# Patient Record
Sex: Female | Born: 1970 | Marital: Single | Smoking: Current some day smoker
Health system: Southern US, Community
[De-identification: ages and names within clinical notes are randomized; demographics above are authoritative.]

## PROBLEM LIST (undated history)

## (undated) DIAGNOSIS — J45909 Unspecified asthma, uncomplicated: Secondary | ICD-10-CM

---

## 2010-10-29 ENCOUNTER — Other Ambulatory Visit: Payer: Self-pay | Admitting: Obstetrics & Gynecology

## 2010-10-29 DIAGNOSIS — O3660X0 Maternal care for excessive fetal growth, unspecified trimester, not applicable or unspecified: Secondary | ICD-10-CM

## 2010-11-08 ENCOUNTER — Ambulatory Visit (HOSPITAL_COMMUNITY)
Admission: RE | Admit: 2010-11-08 | Discharge: 2010-11-08 | Disposition: A | Discharge status: Home / Self Care | Payer: Medicaid Other | Source: Ambulatory Visit | Attending: Obstetrics & Gynecology | Admitting: Obstetrics & Gynecology

## 2010-11-08 ENCOUNTER — Other Ambulatory Visit: Payer: Self-pay | Admitting: Obstetrics & Gynecology

## 2010-11-08 DIAGNOSIS — Z3689 Encounter for other specified antenatal screening: Secondary | ICD-10-CM | POA: Insufficient documentation

## 2010-11-08 DIAGNOSIS — O3660X0 Maternal care for excessive fetal growth, unspecified trimester, not applicable or unspecified: Secondary | ICD-10-CM | POA: Insufficient documentation

## 2010-12-12 ENCOUNTER — Inpatient Hospital Stay (HOSPITAL_COMMUNITY)
Admission: AD | Admit: 2010-12-12 | Discharge: 2010-12-15 | DRG: 775 | Disposition: A | Discharge status: Home / Self Care | Payer: Medicaid Other | Source: Ambulatory Visit | Attending: Obstetrics and Gynecology | Admitting: Obstetrics and Gynecology

## 2010-12-12 DIAGNOSIS — O09519 Supervision of elderly primigravida, unspecified trimester: Secondary | ICD-10-CM | POA: Diagnosis present

## 2010-12-12 DIAGNOSIS — O480 Post-term pregnancy: Secondary | ICD-10-CM | POA: Diagnosis present

## 2010-12-12 DIAGNOSIS — O139 Gestational [pregnancy-induced] hypertension without significant proteinuria, unspecified trimester: Principal | ICD-10-CM | POA: Diagnosis present

## 2010-12-12 LAB — URINALYSIS, ROUTINE W REFLEX MICROSCOPIC
Bilirubin Urine: NEGATIVE
Glucose, UA: NEGATIVE mg/dL
Ketones, ur: NEGATIVE mg/dL
Leukocytes, UA: NEGATIVE
Protein, ur: NEGATIVE mg/dL

## 2010-12-12 LAB — PROTEIN / CREATININE RATIO, URINE
Creatinine, Urine: 49.4 mg/dL
Total Protein, Urine: 7.1 mg/dL

## 2010-12-12 LAB — CBC
Hemoglobin: 11.9 g/dL — ABNORMAL LOW (ref 12.0–15.0)
MCH: 30.7 pg (ref 26.0–34.0)
MCHC: 33.6 g/dL (ref 30.0–36.0)
MCV: 91.2 fL (ref 78.0–100.0)

## 2010-12-12 LAB — COMPREHENSIVE METABOLIC PANEL
Alkaline Phosphatase: 166 U/L — ABNORMAL HIGH (ref 39–117)
BUN: 13 mg/dL (ref 6–23)
Chloride: 103 mEq/L (ref 96–112)
Glucose, Bld: 78 mg/dL (ref 70–99)
Potassium: 4.1 mEq/L (ref 3.5–5.1)
Total Bilirubin: 0.2 mg/dL — ABNORMAL LOW (ref 0.3–1.2)
Total Protein: 6.4 g/dL (ref 6.0–8.3)

## 2010-12-12 LAB — URINE MICROSCOPIC-ADD ON

## 2010-12-13 DIAGNOSIS — O09519 Supervision of elderly primigravida, unspecified trimester: Secondary | ICD-10-CM

## 2010-12-13 DIAGNOSIS — O139 Gestational [pregnancy-induced] hypertension without significant proteinuria, unspecified trimester: Secondary | ICD-10-CM

## 2010-12-13 DIAGNOSIS — O480 Post-term pregnancy: Secondary | ICD-10-CM

## 2010-12-15 ENCOUNTER — Encounter (HOSPITAL_COMMUNITY)
Admission: RE | Admit: 2010-12-15 | Discharge: 2010-12-15 | Disposition: A | Discharge status: Home / Self Care | Payer: Medicaid Other | Source: Ambulatory Visit | Attending: Obstetrics and Gynecology | Admitting: Obstetrics and Gynecology

## 2010-12-15 DIAGNOSIS — O923 Agalactia: Secondary | ICD-10-CM | POA: Insufficient documentation

## 2014-08-03 ENCOUNTER — Encounter (HOSPITAL_BASED_OUTPATIENT_CLINIC_OR_DEPARTMENT_OTHER): Payer: Self-pay

## 2014-08-03 ENCOUNTER — Emergency Department (HOSPITAL_BASED_OUTPATIENT_CLINIC_OR_DEPARTMENT_OTHER)
Admission: EM | Admit: 2014-08-03 | Discharge: 2014-08-03 | Disposition: A | Discharge status: Home / Self Care | Payer: Medicaid Other | Attending: Emergency Medicine | Admitting: Emergency Medicine

## 2014-08-03 ENCOUNTER — Emergency Department (HOSPITAL_BASED_OUTPATIENT_CLINIC_OR_DEPARTMENT_OTHER): Payer: Medicaid Other

## 2014-08-03 DIAGNOSIS — J45901 Unspecified asthma with (acute) exacerbation: Secondary | ICD-10-CM | POA: Insufficient documentation

## 2014-08-03 DIAGNOSIS — R21 Rash and other nonspecific skin eruption: Secondary | ICD-10-CM | POA: Diagnosis not present

## 2014-08-03 DIAGNOSIS — Z79899 Other long term (current) drug therapy: Secondary | ICD-10-CM | POA: Insufficient documentation

## 2014-08-03 DIAGNOSIS — H9209 Otalgia, unspecified ear: Secondary | ICD-10-CM | POA: Diagnosis not present

## 2014-08-03 DIAGNOSIS — Z720 Tobacco use: Secondary | ICD-10-CM | POA: Diagnosis not present

## 2014-08-03 DIAGNOSIS — R05 Cough: Secondary | ICD-10-CM | POA: Diagnosis present

## 2014-08-03 DIAGNOSIS — J069 Acute upper respiratory infection, unspecified: Secondary | ICD-10-CM | POA: Insufficient documentation

## 2014-08-03 DIAGNOSIS — J40 Bronchitis, not specified as acute or chronic: Secondary | ICD-10-CM

## 2014-08-03 DIAGNOSIS — R059 Cough, unspecified: Secondary | ICD-10-CM

## 2014-08-03 HISTORY — DX: Unspecified asthma, uncomplicated: J45.909

## 2014-08-03 LAB — CBC WITH DIFFERENTIAL/PLATELET
BASOS ABS: 0 10*3/uL (ref 0.0–0.1)
BASOS PCT: 1 % (ref 0–1)
EOS ABS: 0.1 10*3/uL (ref 0.0–0.7)
Eosinophils Relative: 2 % (ref 0–5)
HEMATOCRIT: 35.4 % — AB (ref 36.0–46.0)
HEMOGLOBIN: 12.2 g/dL (ref 12.0–15.0)
Lymphocytes Relative: 43 % (ref 12–46)
Lymphs Abs: 2.2 10*3/uL (ref 0.7–4.0)
MCH: 34.6 pg — ABNORMAL HIGH (ref 26.0–34.0)
MCHC: 34.5 g/dL (ref 30.0–36.0)
MCV: 100.3 fL — ABNORMAL HIGH (ref 78.0–100.0)
MONO ABS: 0.6 10*3/uL (ref 0.1–1.0)
Monocytes Relative: 12 % (ref 3–12)
NEUTROS PCT: 42 % — AB (ref 43–77)
Neutro Abs: 2 10*3/uL (ref 1.7–7.7)
PLATELETS: 170 10*3/uL (ref 150–400)
RBC: 3.53 MIL/uL — AB (ref 3.87–5.11)
RDW: 14.2 % (ref 11.5–15.5)
WBC: 4.9 10*3/uL (ref 4.0–10.5)

## 2014-08-03 LAB — COMPREHENSIVE METABOLIC PANEL
ALK PHOS: 85 U/L (ref 39–117)
ALT: 58 U/L — ABNORMAL HIGH (ref 0–35)
AST: 112 U/L — AB (ref 0–37)
Albumin: 3.5 g/dL (ref 3.5–5.2)
Anion gap: 3 — ABNORMAL LOW (ref 5–15)
BILIRUBIN TOTAL: 0.5 mg/dL (ref 0.3–1.2)
BUN: 11 mg/dL (ref 6–23)
CO2: 27 mmol/L (ref 19–32)
Calcium: 8.2 mg/dL — ABNORMAL LOW (ref 8.4–10.5)
Chloride: 106 mmol/L (ref 96–112)
Creatinine, Ser: 0.66 mg/dL (ref 0.50–1.10)
GFR calc non Af Amer: >90 mL/min (ref >90)
Glucose, Bld: 102 mg/dL — ABNORMAL HIGH (ref 70–99)
POTASSIUM: 3.2 mmol/L — AB (ref 3.5–5.1)
SODIUM: 136 mmol/L (ref 135–145)
Total Protein: 6.3 g/dL (ref 6.0–8.3)

## 2014-08-03 MED ORDER — SODIUM CHLORIDE 0.9 % IV SOLN
INTRAVENOUS | Status: DC
  Administered 2014-08-03: 17:00:00 via INTRAVENOUS

## 2014-08-03 MED ORDER — PREDNISONE 10 MG PO TABS: 40.0000 mg | ORAL_TABLET | Freq: Every day | ORAL | Status: AC

## 2014-08-03 MED ORDER — ONDANSETRON HCL 4 MG/2ML IJ SOLN
4.0000 mg | Freq: Once | INTRAMUSCULAR | Status: AC
  Administered 2014-08-03: 4 mg via INTRAVENOUS
  Filled 2014-08-03: qty 2

## 2014-08-03 MED ORDER — ALBUTEROL SULFATE HFA 108 (90 BASE) MCG/ACT IN AERS
2.0000 | INHALATION_SPRAY | Freq: Four times a day (QID) | RESPIRATORY_TRACT | Status: DC | PRN
  Administered 2014-08-03: 2 via RESPIRATORY_TRACT
  Filled 2014-08-03: qty 6.7

## 2014-08-03 MED ORDER — METHYLPREDNISOLONE SODIUM SUCC 125 MG IJ SOLR
125.0000 mg | Freq: Once | INTRAMUSCULAR | Status: AC
  Administered 2014-08-03: 125 mg via INTRAVENOUS
  Filled 2014-08-03: qty 2

## 2014-08-03 MED ORDER — SODIUM CHLORIDE 0.9 % IV BOLUS (SEPSIS)
1000.0000 mL | Freq: Once | INTRAVENOUS | Status: AC
  Administered 2014-08-03: 1000 mL via INTRAVENOUS

## 2014-08-03 NOTE — Discharge Instructions (Signed)
Usual albuterol inhaler 2 puffs every 6 hours for the next 7 days then as needed. Take the prednisone as directed for the next 5 days. Can supplement the with a cough suppressant medicine over-the-counter. Return for any new or worse symptoms.

## 2014-08-03 NOTE — ED Notes (Signed)
C/o loss of voice and "a cold" x 3 months "just barely when asked if cough present

## 2014-08-03 NOTE — ED Provider Notes (Signed)
CSN: [id number]     Arrival date & time 08/03/14  1358 History   First MD Initiated Contact with Patient 08/03/14 1604     Chief Complaint  Patient presents with  . URI     (Consider location/radiation/quality/duration/timing/severity/associated sxs/prior Treatment) Patient is a 44 y.o. female presenting with URI. The history is provided by the patient.  URI Presenting symptoms: congestion, cough and ear pain   Presenting symptoms: no fever   Associated symptoms: wheezing   Associated symptoms: no headaches and no neck pain    patient with three-month history of upper respiratory infection cough bronchitis, hoarseness. And a complaint of left ear pain. Patient has a history of asthma as been out of her albuterol inhaler for 3 weeks. Patient denies fever nausea vomiting or diarrhea.  Past Medical History  Diagnosis Date  . Asthma    History reviewed. No pertinent past surgical history. No family history on file. History  Substance Use Topics  . Smoking status: Current Some Day Smoker  . Smokeless tobacco: Not on file  . Alcohol Use: No   OB History    No data available     Review of Systems  Constitutional: Negative for fever.  HENT: Positive for congestion, ear pain and voice change.   Eyes: Negative for redness.  Respiratory: Positive for cough, shortness of breath and wheezing.   Cardiovascular: Negative for chest pain.  Gastrointestinal: Negative for nausea, vomiting and diarrhea.  Genitourinary: Negative for dysuria.  Musculoskeletal: Negative for back pain and neck pain.  Skin: Positive for rash.  Neurological: Negative for headaches.  Hematological: Does not bruise/bleed easily.  Psychiatric/Behavioral: Negative for confusion.      Allergies  Review of patient's allergies indicates no known allergies.  Home Medications   Prior to Admission medications   Medication Sig Start Date End Date Taking? Authorizing Provider  ALBUTEROL IN Inhale into the lungs.    Yes Historical Provider, MD  predniSONE (DELTASONE) 10 MG tablet Take 4 tablets (40 mg total) by mouth daily. 08/03/14   Vanetta MuldersScott Albion Weatherholtz, MD   BP 145/93 mmHg  Pulse 98  Temp(Src) 98.5 F (36.9 C) (Oral)  Resp 18  Ht 5\' 8"  (1.727 m)  Wt 215 lb (97.523 kg)  BMI 32.70 kg/m2  SpO2 98% Physical Exam  Constitutional: She appears well-developed and well-nourished. No distress.  HENT:  Head: Normocephalic and atraumatic.  Right Ear: External ear normal.  Left Ear: External ear normal.  Mouth/Throat: Oropharynx is clear and moist.  Eyes: Conjunctivae and EOM are normal. Pupils are equal, round, and reactive to light.  Neck: Normal range of motion. No thyromegaly present.  Patient with hoarseness but no enlargement to the thyroid.  Cardiovascular: Normal rate, regular rhythm and normal heart sounds.   No murmur heard. Pulmonary/Chest: Effort normal and breath sounds normal. No stridor. No respiratory distress. She has no wheezes.  Abdominal: Soft. Bowel sounds are normal. There is no tenderness.  Musculoskeletal: Normal range of motion.  Neurological: She is alert. No cranial nerve deficit. She exhibits normal muscle tone. Coordination normal.  Skin: Rash noted.  Rash around the mouth. Oral pharynx is clear. Suggestive of some fever blisters. No secondary infection. No other rash.  Nursing note and vitals reviewed.   ED Course  Procedures (including critical care time) Labs Review Labs Reviewed  COMPREHENSIVE METABOLIC PANEL - Abnormal; Notable for the following:    Potassium 3.2 (*)    Glucose, Bld 102 (*)    Calcium 8.2 (*)  AST 112 (*)    ALT 58 (*)    Anion gap 3 (*)    All other components within normal limits  CBC WITH DIFFERENTIAL/PLATELET - Abnormal; Notable for the following:    RBC 3.53 (*)    HCT 35.4 (*)    MCV 100.3 (*)    MCH 34.6 (*)    Neutrophils Relative % 42 (*)    All other components within normal limits    Imaging Review Dg Chest 2  View  08/03/2014   CLINICAL DATA:  44 year old with a cough, congestion and fatigue for 3 months.  EXAM: CHEST  2 VIEW  COMPARISON:  None.  FINDINGS: Few linear densities in the right lower lung may represent atelectasis. Otherwise, the lungs are clear. Heart size is normal. The trachea is midline. Negative for pleural effusions. No acute bone abnormalities.  IMPRESSION: Few linear densities in the right lower chest could represent atelectasis. Otherwise, no acute chest findings.   Electronically Signed   By: Richarda Overlie M.D.   On: 08/03/2014 16:38     EKG Interpretation None      MDM   Final diagnoses:  Cough  URI (upper respiratory infection)  Bronchitis    By history patient's had recurrent upper respiratory infections and bronchitis over the past 3 months. No wheezing here today. Workup negative for pneumonia significant leukocytosis or fever no wheezing. Patient will benefit from albuterol inhaler she does have a history of asthma and also what helped the cough and bronchitis. Patient 2 puffs every 6 hours next 7 days then as needed. Albuterol inhaler provided before she left. In addition patient will have a course of prednisone for the next 5 days. Initiated with Solu-Medrol 125 mg IV. Patient will return for any new or worse symptoms. Chest x-ray negative for pneumonia. Labs without significant anemia no significant which might abnormalities. Just a mild hypokalemia. Mild elevation in liver function tests.    Vanetta Mulders, MD 08/03/14 1747

## 2015-12-15 IMAGING — CR DG CHEST 2V
2 series · 2 of 2 positions shown · non-contrast
Comparison: None.

CLINICAL DATA: 43-year-old with a cough, congestion and fatigue for
3 months.

EXAM:
CHEST  2 VIEW

[w chest pa]
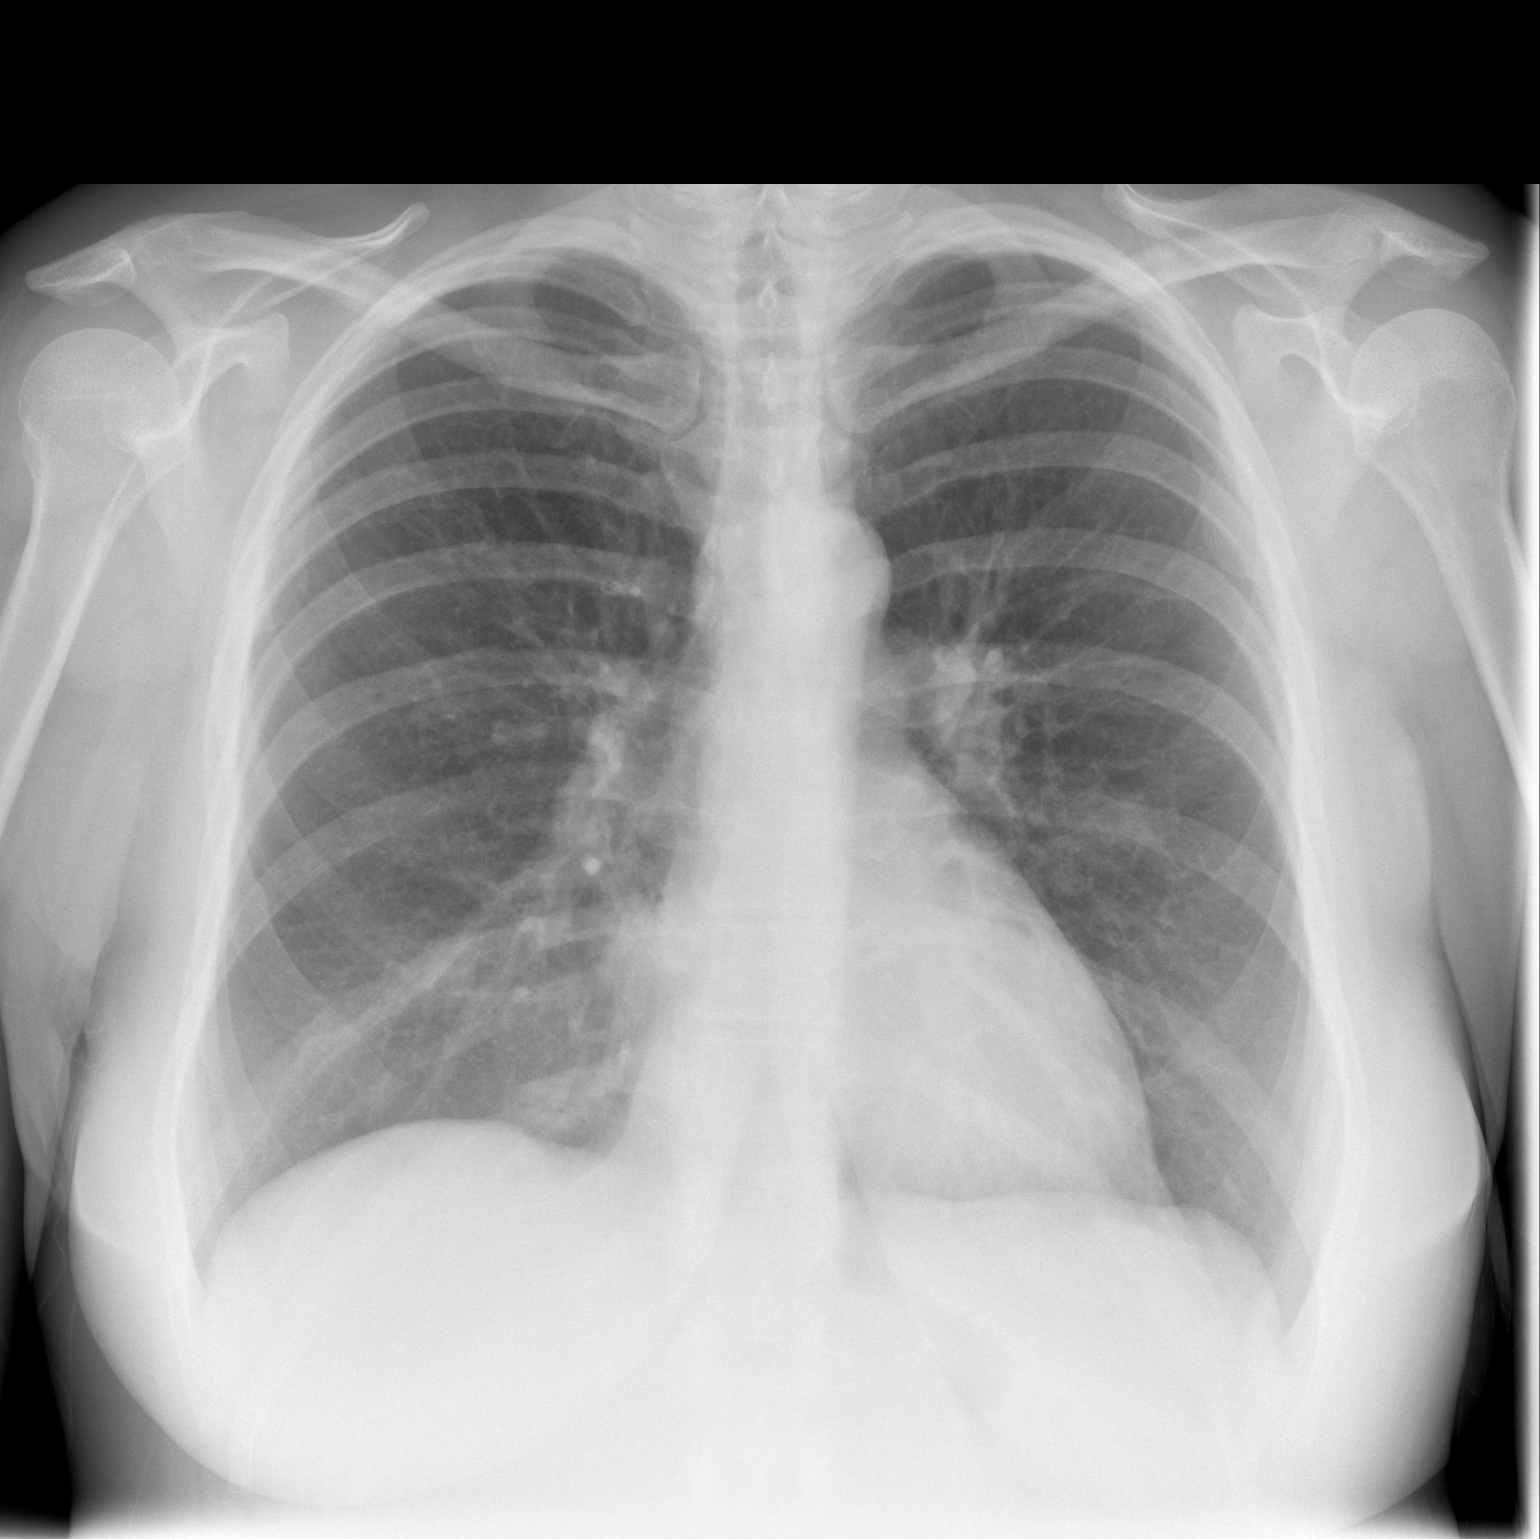

[w chest lat]
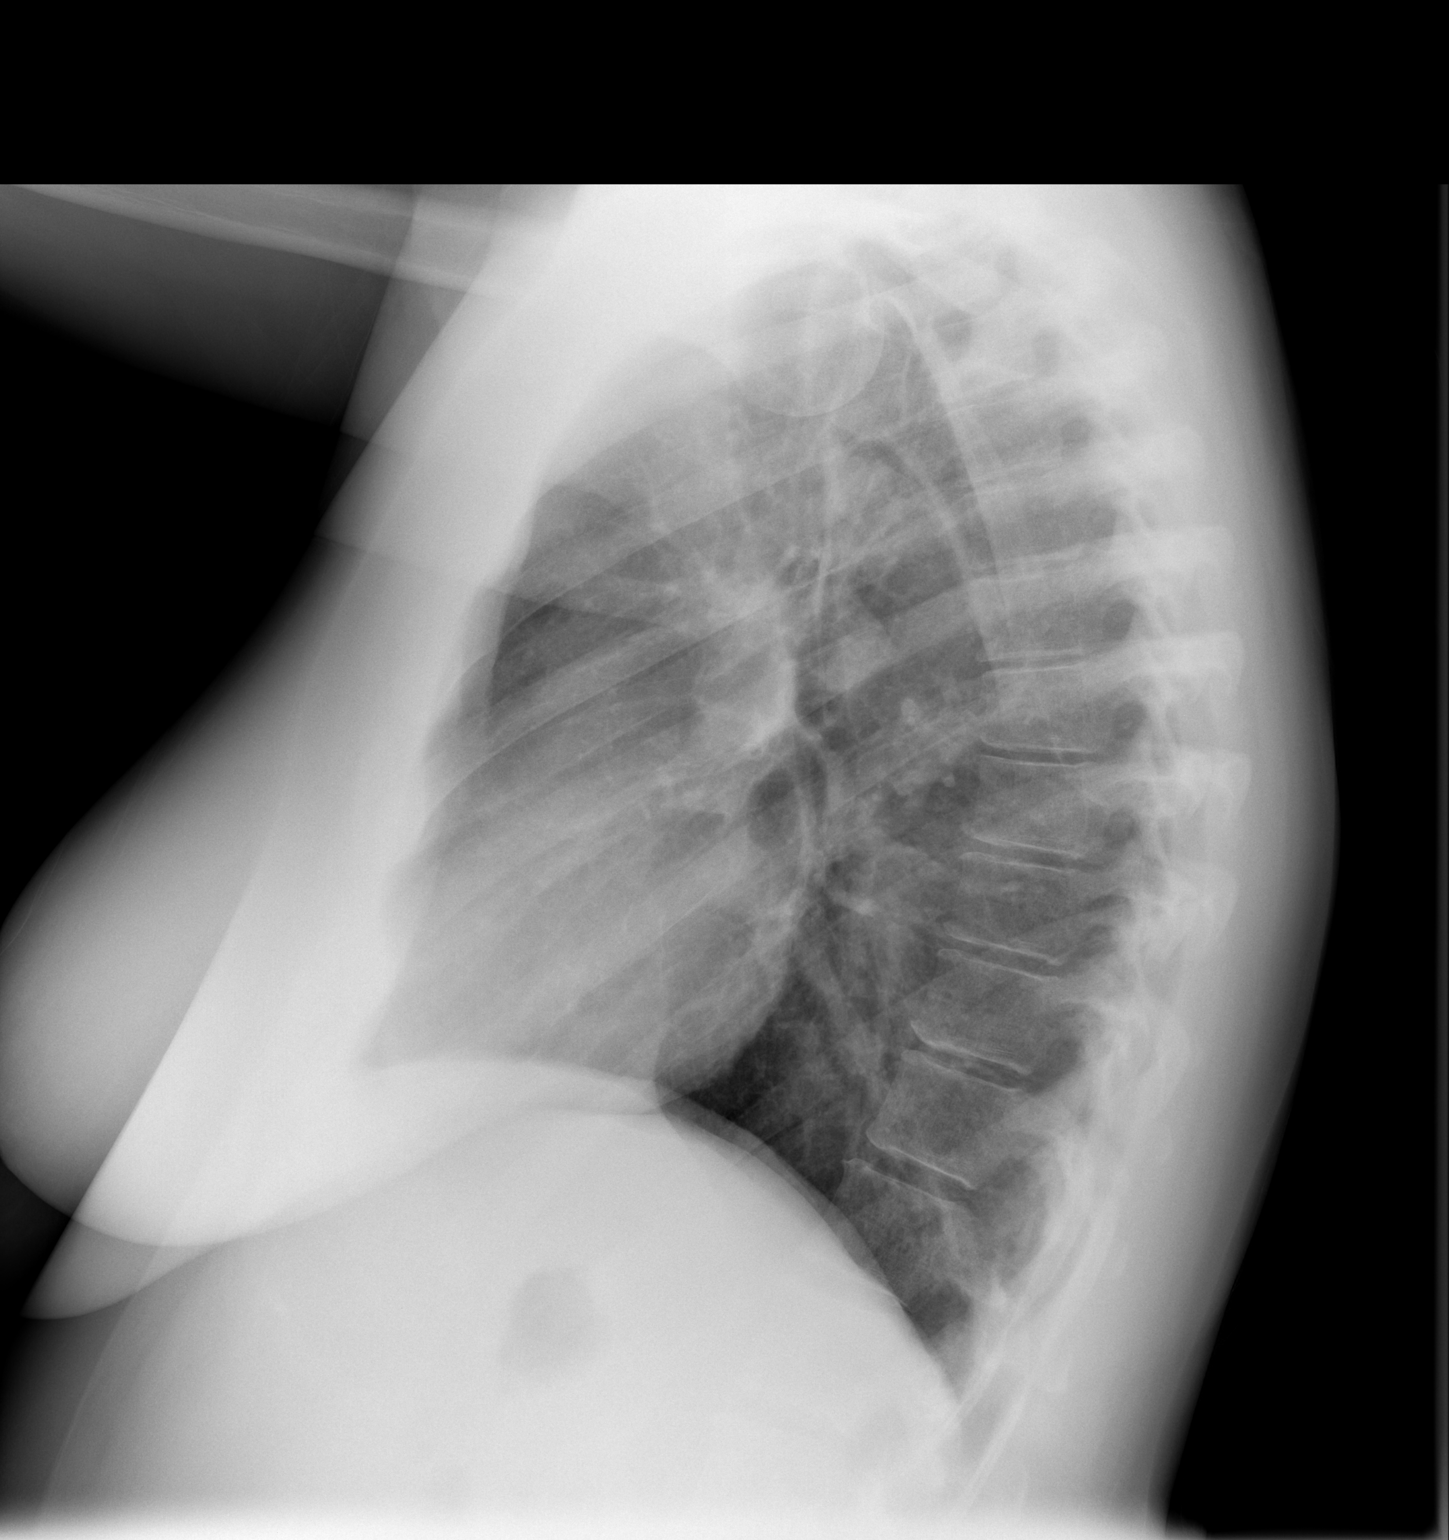

[2 of 2 positions shown; findings below may reference images not displayed]

FINDINGS: Few linear densities in the right lower lung may represent
atelectasis. Otherwise, the lungs are clear. Heart size is normal.
The trachea is midline. Negative for pleural effusions. No acute
bone abnormalities.
IMPRESSION: Few linear densities in the right lower chest could represent
atelectasis. Otherwise, no acute chest findings.

## 2017-09-10 ENCOUNTER — Telehealth: Payer: Self-pay | Admitting: Registered Nurse

## 2017-09-10 NOTE — Telephone Encounter (Signed)
error
